# Patient Record
Sex: Male | Born: 1995 | Race: White | Hispanic: No | Marital: Single | Smoking: Never smoker
Health system: Southern US, Community
[De-identification: ages and names within clinical notes are randomized; demographics above are authoritative.]

## PROBLEM LIST (undated history)

## (undated) DIAGNOSIS — M1990 Unspecified osteoarthritis, unspecified site: Secondary | ICD-10-CM

## (undated) HISTORY — DX: Unspecified osteoarthritis, unspecified site: M19.90

## (undated) HISTORY — PX: TYMPANOSTOMY TUBE PLACEMENT: SHX32

---
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
## 2017-02-10 IMAGING — CR DG TOE GREAT 2+V*R*
1 series · 3 of 3 positions shown · non-contrast
Comparison: None.

CLINICAL DATA: Pain and swelling after kicking soccer ball and
hitting toe on ground

EXAM:
RIGHT FIRST TOE

[Series 1: dg toe great right · 0.14mm/px · 3 of 3 slices shown]
[im 1/3]
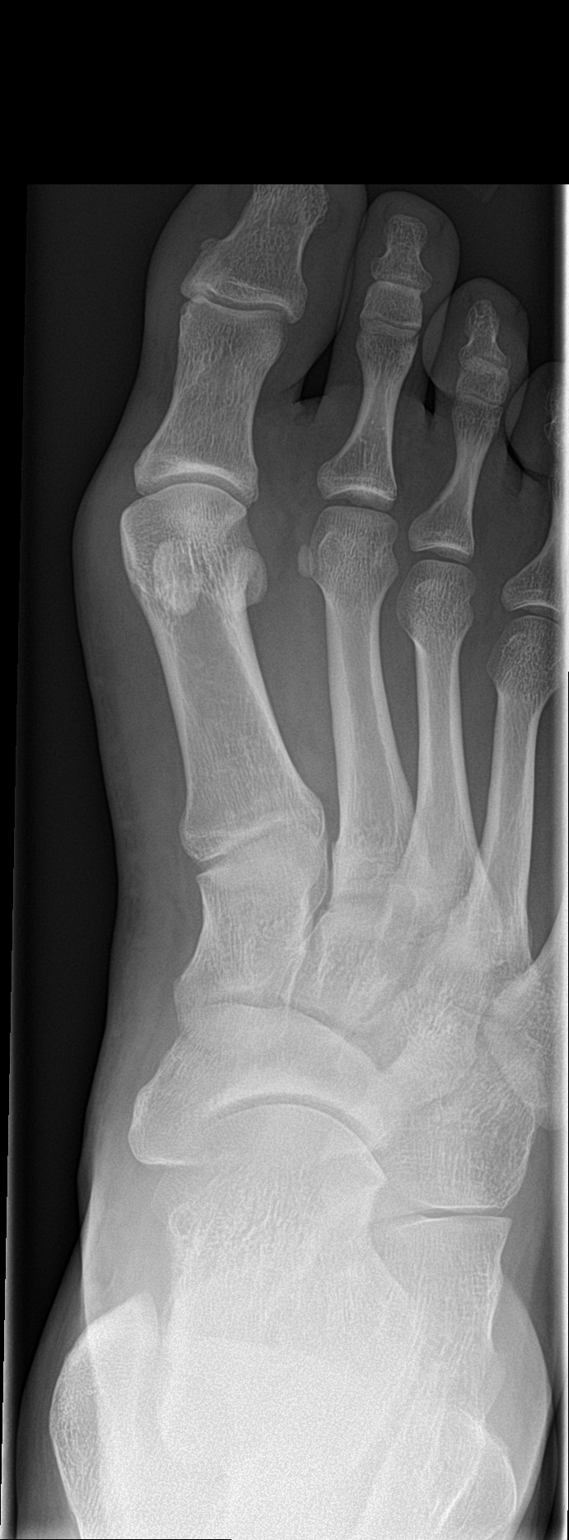
[im 2/3]
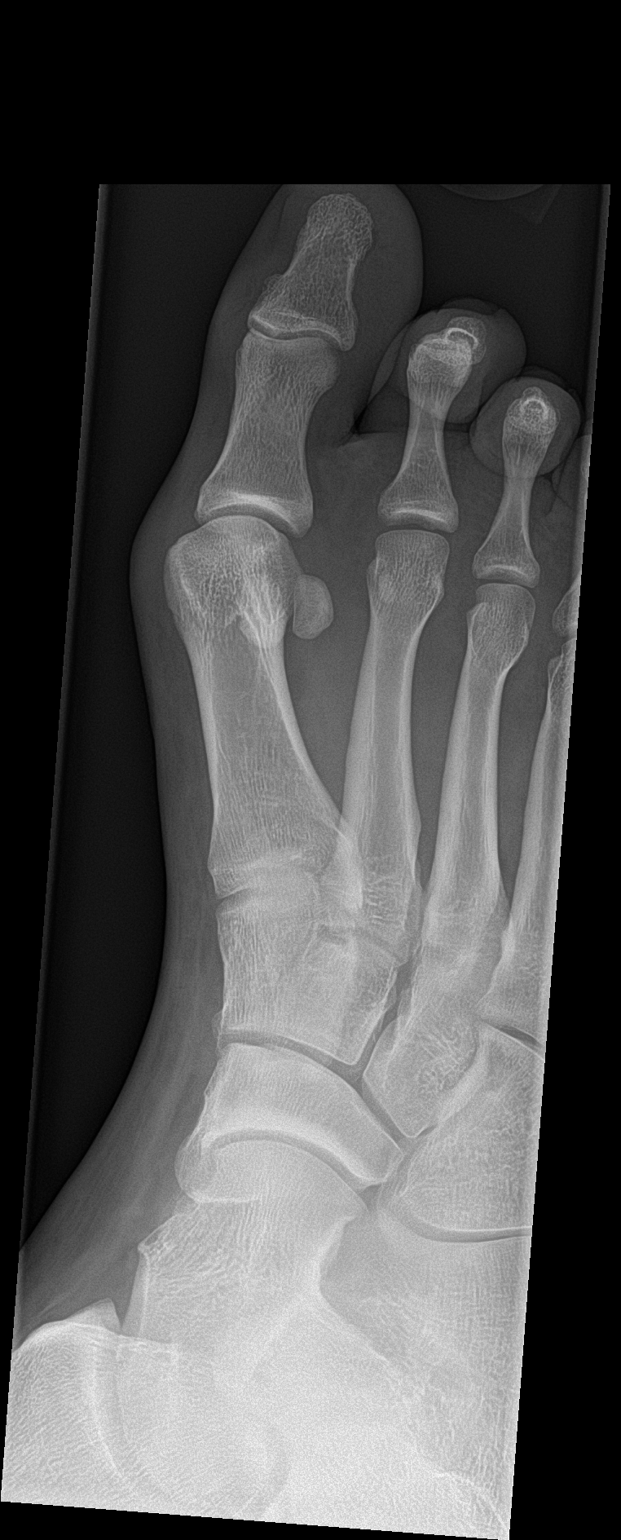
[im 3/3]
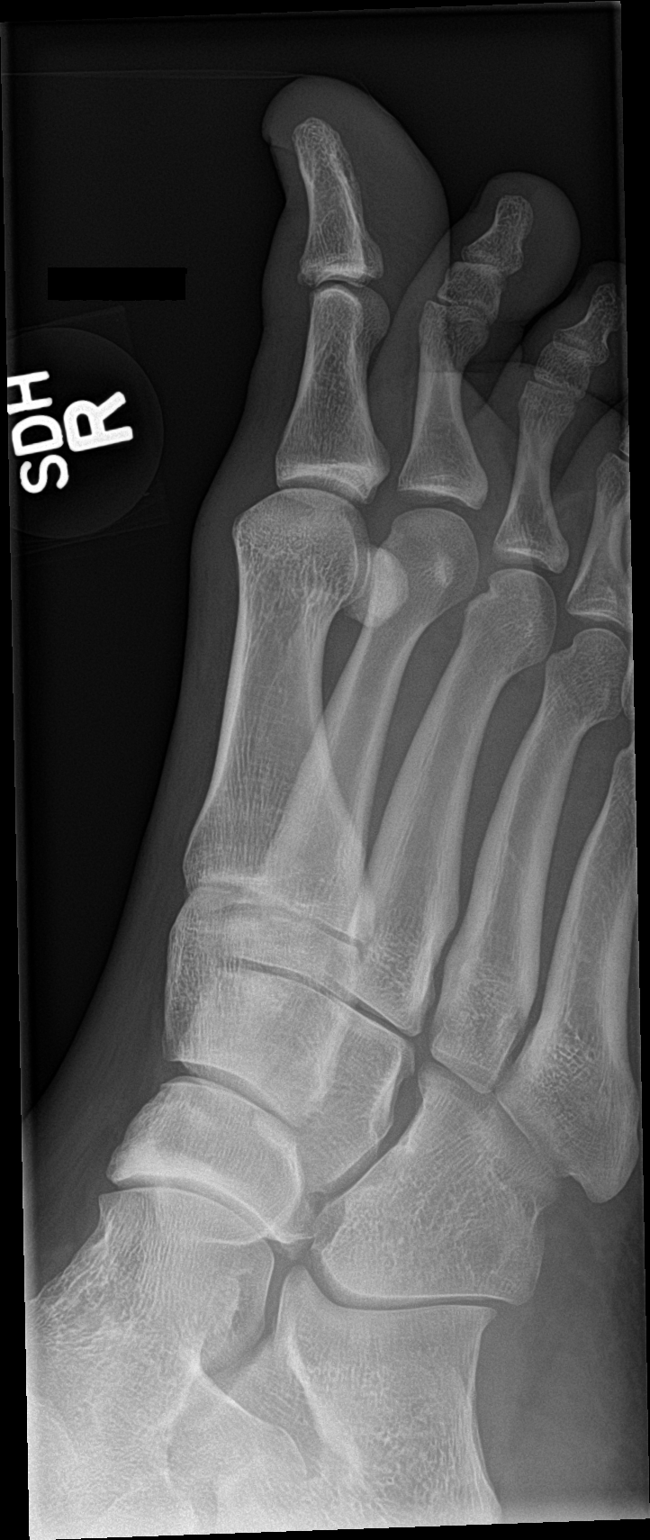

[3 of 3 positions shown; findings below may reference images not displayed]

FINDINGS: Frontal, oblique, and lateral views were obtained. There is an
avulsion along the dorsal aspect of the proximal portion of the
first distal phalanx. Alignment is near anatomic. No other fracture.
No dislocation. Joint spaces appear intact. There is slight hallux
valgus deformity at the first MTP joint.
IMPRESSION: Avulsion along the dorsal aspect of the proximal portion of the
first distal phalanx in near anatomic alignment. No other fracture.
No dislocation. Mild hallux valgus deformity at the first MTP joint.

## 2017-03-16 ENCOUNTER — Ambulatory Visit (INDEPENDENT_AMBULATORY_CARE_PROVIDER_SITE_OTHER): Payer: Medicaid Other | Admitting: Family Medicine

## 2017-03-16 ENCOUNTER — Encounter: Payer: Self-pay | Admitting: Family Medicine

## 2017-03-16 VITALS — BP 118/91 | HR 84 | Temp 98.2°F | Wt 289.0 lb

## 2017-03-16 DIAGNOSIS — M7662 Achilles tendinitis, left leg: Secondary | ICD-10-CM | POA: Diagnosis not present

## 2017-03-16 MED ORDER — PREDNISONE 10 MG PO TABS: ORAL_TABLET | ORAL | 0 refills | Status: AC

## 2017-03-16 NOTE — Progress Notes (Signed)
   BP (!) 118/91   Pulse 84   Temp 98.2 F (36.8 C)   Wt 289 lb (131.1 kg)    Subjective:    Patient ID: Xavier AmbleAustin C Haber, male    DOB: 07/18/1996, 21 y.o.   MRN: [id number]  HPI: Xavier Ambleustin C Jagoda is a 21 y.o. male  Chief Complaint  Patient presents with  . Knee Pain    right x 1 week, when he walks feels like something is pushing up on it. Feeling a little bit better. No swelling.  . Ankle Pain    left x 5 days. feels like someone is squeezing it. No swelling. No known injury.   Patient presents with about 1 week of left achilles tendon and right knee pain. States the knee pain is all but resolved but the achilles pain persists. 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 Trying ibuprofen and BC powders with mild relief. No known injury or previous issues like this, swelling, bruising, or decreased ROM.   Relevant past medical, surgical, family and social history reviewed and updated as indicated. Interim medical history since our last visit reviewed. Allergies and medications reviewed and updated.  Review of Systems  Constitutional: Negative.   Respiratory: Negative.   Cardiovascular: Negative.   Gastrointestinal: Negative.   Genitourinary: Negative.   Musculoskeletal: Positive for arthralgias.  Neurological: Negative for numbness.  Psychiatric/Behavioral: Negative.    Per HPI unless specifically indicated above     Objective:    BP (!) 118/91   Pulse 84   Temp 98.2 F (36.8 C)   Wt 289 lb (131.1 kg)   Wt Readings from Last 3 Encounters:  03/16/17 289 lb (131.1 kg) (>99 %, Z= 2.93)*  01/22/16 265 lb (120.2 kg) (>99 %, Z= 2.69)*  01/17/16 272 lb 12.8 oz (123.7 kg) (>99 %, Z= 2.79)*   * Growth percentiles are based on CDC 2-20 Years data.    Physical Exam  Constitutional: He is oriented to person, place, and time. He appears well-developed and well-nourished. No distress.    HENT:  Head: Atraumatic.  Eyes: Pupils are equal, round, and reactive to light. Conjunctivae are normal. No scleral icterus.  Neck: Normal range of motion. Neck supple.  Cardiovascular: Normal rate and normal heart sounds.   Pulmonary/Chest: Effort normal and breath sounds normal.  Musculoskeletal: Normal range of motion.  Minimal ttp and edema over left achilles Pain is reproduced with flexion at left ankle  Right knee with no edema or ttp, full strength and ROM intact  Neurological: He is alert and oriented to person, place, and time.  Skin: Skin is warm and dry.  Psychiatric: He has a normal mood and affect. His behavior is normal.  Nursing note and vitals reviewed.     Assessment & Plan:   Problem List Items Addressed This Visit    None    Visit Diagnoses    Achilles tendinitis of left lower extremity    -  Primary   Prednisone taper sent, tylenol prn for pain relief. Ice/heat alternating, gentle stretches (handout given). Rest, elevate, stay hydrated. F/u if no improvement      Follow up plan: Return if symptoms worsen or fail to improve.

## 2017-03-16 NOTE — Patient Instructions (Addendum)
Can take 1000 mg tylenol twice daily, can take 800 mg ibuprofen 1-2 times daily  If taking ibuprofen while taking prednisone, can add zantac or prilosec to help protect your stomach from irritation   Achilles Tendinitis Rehab Ask your health care provider which exercises are safe for you. Do exercises exactly as told by your health care provider and adjust them as directed. It is normal to feel mild stretching, pulling, tightness, or discomfort as you do these exercises, but you should stop right away if you feel sudden pain or your pain gets worse. Do not begin these exercises until told by your health care provider. Stretching and range of motion exercises These exercises warm up your muscles and joints and improve the movement and flexibility of your ankle. These exercises also help to relieve pain, numbness, and tingling. Exercise A: Standing wall calf stretch, knee straight  1. Stand with your hands against a wall. 2. Extend your __________ leg behind you and bend your front knee slightly. Keep both of your heels on the floor. 3. Point the toes of your back foot slightly inward. 4. Keeping your heels on the floor and your back knee straight, shift your weight toward the wall. Do not allow your back to arch. You should feel a gentle stretch in your calf. 5. Hold this position for seconds. Repeat __________ times. Complete this stretch __________ times per day. Exercise B: Standing wall calf stretch, knee bent 1. Stand with your hands against a wall. 2. Extend your __________ leg behind you, and bend your front knee slightly. Keep both of your heels on the floor. 3. Point the toes of your back foot slightly inward. 4. Keeping your heels on the floor, unlock your back knee so that it is bent. You should feel a gentle stretch deep in your calf. 5. Hold this position for __________ seconds. Repeat __________ times. Complete this stretch __________ times per day. Strengthening exercises These  exercises build strength and control of your ankle. Endurance is the ability to use your muscles for a long time, even after they get tired. Exercise C: Plantar flexion with band  1. Sit on the floor with your __________ leg extended. You may put a pillow under your calf to give your foot more room to move. 2. Loop a rubber exercise band or tube around the ball of your __________ foot. The ball of your foot is on the walking surface, right under your toes. The band or tube should be slightly tense when your foot is relaxed. If the band or tube slips, you can put on your shoe or put a washcloth between the band and your foot to help it stay in place. 3. Slowly point your toes downward, pushing them away from you. 4. Hold this position for __________ seconds. 5. Slowly release the tension in the band or tube, controlling smoothly until your foot is back to the starting position. Repeat __________ times. Complete this exercise __________ times per day. Exercise D: Heel raise with eccentric lower  1. Stand on a step with the balls of your feet. The ball of your foot is on the walking surface, right under your toes. ? Do not put your heels on the step. ? For balance, rest your hands on the wall or on a railing. 2. Rise up onto the balls of your feet. 3. Keeping your heels up, shift all of your weight to your __________ leg and pick up your other leg. 4. Slowly lower your __________  leg so your heel drops below the level of the step. 5. Put down your foot. If told by your health care provider, build up to:  3 sets of 15 repetitions while keeping your knees straight.  3 sets of 15 repetitions while keeping your knees bent as far as told by your health care provider.  Complete this exercise __________ times per day. If this exercise is too easy, try doing it while wearing a backpack with weights in it. Balance exercises These exercises improve or maintain your balance. Balance is important in  preventing falls. Exercise E: Single leg stand 1. Without shoes, stand near a railing or in a door frame. Hold on to the railing or door frame as needed. 2. Stand on your __________ foot. Keep your big toe down on the floor and try to keep your arch lifted. 3. Hold this position for __________ seconds. Repeat __________ times. Complete this exercise __________ times per day. If this exercise is too easy, you can try it with your eyes closed or while standing on a pillow. This information is not intended to replace advice given to you by your health care provider. Make sure you discuss any questions you have with your health care provider. Document Released: 03/18/2005 Document Revised: 04/23/2016 Document Reviewed: 04/23/2015 Elsevier Interactive Patient Education  [employer]2018 [employer]

## 2021-11-01 DIAGNOSIS — S61211A Laceration without foreign body of left index finger without damage to nail, initial encounter: Secondary | ICD-10-CM | POA: Diagnosis not present

## 2021-11-17 DIAGNOSIS — S61219D Laceration without foreign body of unspecified finger without damage to nail, subsequent encounter: Secondary | ICD-10-CM | POA: Diagnosis not present

## 2021-12-05 DIAGNOSIS — S61219D Laceration without foreign body of unspecified finger without damage to nail, subsequent encounter: Secondary | ICD-10-CM | POA: Diagnosis not present

## 2023-04-15 DIAGNOSIS — I10 Essential (primary) hypertension: Secondary | ICD-10-CM | POA: Diagnosis not present

## 2023-04-15 DIAGNOSIS — R58 Hemorrhage, not elsewhere classified: Secondary | ICD-10-CM | POA: Diagnosis not present

## 2023-04-16 ENCOUNTER — Emergency Department (HOSPITAL_COMMUNITY): Payer: Medicaid Other

## 2023-04-16 ENCOUNTER — Emergency Department (HOSPITAL_COMMUNITY)
Admission: EM | Admit: 2023-04-16 | Discharge: 2023-04-16 | Disposition: A | Discharge status: Home / Self Care | Payer: Medicaid Other | Attending: Emergency Medicine | Admitting: Emergency Medicine

## 2023-04-16 ENCOUNTER — Encounter (HOSPITAL_COMMUNITY): Payer: Self-pay

## 2023-04-16 ENCOUNTER — Other Ambulatory Visit: Payer: Self-pay

## 2023-04-16 DIAGNOSIS — S0181XA Laceration without foreign body of other part of head, initial encounter: Secondary | ICD-10-CM | POA: Diagnosis not present

## 2023-04-16 DIAGNOSIS — R7309 Other abnormal glucose: Secondary | ICD-10-CM

## 2023-04-16 DIAGNOSIS — S0003XA Contusion of scalp, initial encounter: Secondary | ICD-10-CM | POA: Diagnosis not present

## 2023-04-16 DIAGNOSIS — R739 Hyperglycemia, unspecified: Secondary | ICD-10-CM | POA: Diagnosis not present

## 2023-04-16 DIAGNOSIS — S199XXA Unspecified injury of neck, initial encounter: Secondary | ICD-10-CM | POA: Diagnosis not present

## 2023-04-16 DIAGNOSIS — S0990XA Unspecified injury of head, initial encounter: Secondary | ICD-10-CM | POA: Diagnosis not present

## 2023-04-16 DIAGNOSIS — R030 Elevated blood-pressure reading, without diagnosis of hypertension: Secondary | ICD-10-CM | POA: Insufficient documentation

## 2023-04-16 DIAGNOSIS — Y990 Civilian activity done for income or pay: Secondary | ICD-10-CM | POA: Insufficient documentation

## 2023-04-16 LAB — BASIC METABOLIC PANEL
Anion gap: 11 (ref 5–15)
BUN: 12 mg/dL (ref 6–20)
CO2: 27 mmol/L (ref 22–32)
Calcium: 8.5 mg/dL — ABNORMAL LOW (ref 8.9–10.3)
Chloride: 100 mmol/L (ref 98–111)
Creatinine, Ser: 1.13 mg/dL (ref 0.61–1.24)
GFR, Estimated: >60 mL/min (ref >60)
Glucose, Bld: 148 mg/dL — ABNORMAL HIGH (ref 70–99)
Potassium: 3.6 mmol/L (ref 3.5–5.1)
Sodium: 138 mmol/L (ref 135–145)

## 2023-04-16 LAB — CBC WITH DIFFERENTIAL/PLATELET
Abs Immature Granulocytes: 0.06 10*3/uL (ref 0.00–0.07)
Basophils Absolute: 0 10*3/uL (ref 0.0–0.1)
Basophils Relative: 0 %
Eosinophils Absolute: 0.3 10*3/uL (ref 0.0–0.5)
Eosinophils Relative: 3 %
HCT: 42.7 % (ref 39.0–52.0)
Hemoglobin: 13.8 g/dL (ref 13.0–17.0)
Immature Granulocytes: 1 %
Lymphocytes Relative: 19 %
Lymphs Abs: 1.9 10*3/uL (ref 0.7–4.0)
MCH: 26.8 pg (ref 26.0–34.0)
MCHC: 32.3 g/dL (ref 30.0–36.0)
MCV: 82.9 fL (ref 80.0–100.0)
Monocytes Absolute: 0.7 10*3/uL (ref 0.1–1.0)
Monocytes Relative: 7 %
Neutro Abs: 6.9 10*3/uL (ref 1.7–7.7)
Neutrophils Relative %: 70 %
Platelets: 314 10*3/uL (ref 150–400)
RBC: 5.15 MIL/uL (ref 4.22–5.81)
RDW: 13.2 % (ref 11.5–15.5)
WBC: 9.8 10*3/uL (ref 4.0–10.5)
nRBC: 0 % (ref 0.0–0.2)

## 2023-04-16 LAB — TYPE AND SCREEN
ABO/RH(D): O POS
Antibody Screen: NEGATIVE

## 2023-04-16 LAB — ABO/RH: ABO/RH(D): O POS

## 2023-04-16 MED ORDER — SODIUM CHLORIDE 0.9 % IV BOLUS
1000.0000 mL | Freq: Once | INTRAVENOUS | Status: AC
  Administered 2023-04-16: 1000 mL via INTRAVENOUS

## 2023-04-16 NOTE — Discharge Instructions (Signed)
Your blood sugar was high today.  That may be because of the stress of being in the emergency department, but it could also be that you have high blood pressure.  Please have your blood pressure checked several times over the next 2 weeks.  If it is consistently high, you will need to start treatment for it.  High blood pressure which is not adequately treated can lead to heart attacks, strokes, kidney failure.

## 2023-04-16 NOTE — ED Provider Notes (Signed)
[place] EMERGENCY DEPARTMENT AT [facility] Provider Note   CSN: [id number] Arrival date & time: 04/16/23  0004     History  Chief Complaint  Patient presents with   Trauma   Head Injury    Xavier Rosario is a 27 y.o. male.  The history is provided by the patient and the EMS personnel.  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  He denies loss of consciousness.  He denies pain anywhere besides where the laceration was.  He specifically denies neck, back, chest, abdomen, extremity pain.  Last tetanus immunization was within the past year.  EMS reports large, deep laceration and inability to control bleeding.   Home Medications Prior to Admission medications   Medication Sig Start Date End Date Taking? Authorizing Provider  EPINEPHrine (ADRENALIN) 1 MG/ML injection Inject 1 mL (1 mg total) into the skin once. If start swelling after having any bug bit in future. Then go to ER or call 911 Immediately. 03/09/15   Altamese Dilling, MD  predniSONE (DELTASONE) 10 MG tablet Take 6 tabs day 1, then 5 tabs day 2, then 4 tabs day 3, etc 03/16/17   Particia Nearing, PA-C      Allergies    Wasp venom and Yellow jacket venom    Review of Systems   Review of Systems  All other systems reviewed and are negative.   Physical Exam Updated Vital Signs BP (!) 154/70 (BP Location: Right Arm)   Pulse (!) 104   Temp 98.2 F (36.8 C) (Oral)   Resp 16   Ht 6\' 1"  (1.854 m)   Wt (!) 181.4 kg   SpO2 99%   BMI 52.77 kg/m  Physical Exam Vitals and nursing note reviewed.   27 year old male, resting comfortably and in no acute distress. Vital signs are significant for elevated blood pressure and heart rate. Oxygen saturation is 99%, which is normal. Head is normocephalic.  Deep laceration is present across the right upper forehead with steady streaming of blood but no pulsatile bleeding.  PERRLA, EOMI. Oropharynx is clear. Neck is nontender. Back is nontender. Lungs are clear without rales, wheezes, or rhonchi. Chest is nontender. Heart has regular rate and rhythm without murmur. Abdomen is soft, flat, nontender. Extremities have no signs of trauma, full range of motion is present. Skin is warm and dry without rash. Neurologic: Mental status is normal, cranial nerves are intact, moves all extremities equally.     ED Results / Procedures / Treatments   Labs (all labs ordered are listed, but only abnormal results are displayed) Labs Reviewed  BASIC METABOLIC PANEL - Abnormal; Notable for the following components:      Result Value   Glucose, Bld 148 (*)    Calcium 8.5 (*)    All other components within normal limits  CBC WITH DIFFERENTIAL/PLATELET  TYPE AND SCREEN  ABO/RH   Radiology CT Head Wo Contrast  Result Date: 04/16/2023 CLINICAL DATA:  Head trauma, moderate-severe; Neck trauma, dangerous injury mechanism (Age 60-64y) Pt from work after forklift accident. Pt hit something and was thrown forward hitting his head on a metal bar EXAM: CT HEAD WITHOUT CONTRAST CT CERVICAL SPINE WITHOUT CONTRAST TECHNIQUE: Multidetector CT imaging of the head and cervical spine was performed following the standard protocol without intravenous contrast. Multiplanar CT image reconstructions of the cervical spine were also generated. RADIATION DOSE REDUCTION: This exam  was performed according to the departmental dose-optimization program which includes automated exposure control, adjustment of the mA and/or kV according to patient size and/or use of iterative reconstruction technique. COMPARISON:  None Available. FINDINGS: CT HEAD FINDINGS Brain: No evidence of large-territorial acute infarction. No parenchymal hemorrhage. No mass lesion. No extra-axial collection. No mass effect or midline shift. No hydrocephalus. Basilar cisterns are patent. Vascular: No hyperdense vessel. Skull: No acute  fracture or focal lesion. Sinuses/Orbits: Paranasal sinuses and mastoid air cells are clear. The orbits are unremarkable. Other: Right frontal scalp 6 mm hematoma formation. CT CERVICAL SPINE FINDINGS Alignment: Normal. Skull base and vertebrae: Degenerative changes of the C4-C5 level. No acute fracture. No aggressive appearing focal osseous lesion or focal pathologic process. Soft tissues and spinal canal: No prevertebral fluid or swelling. No visible canal hematoma. Upper chest: Unremarkable. Other: None. IMPRESSION: 1. No acute intracranial abnormality. 2. No acute displaced fracture or traumatic listhesis of the cervical spine. Electronically Signed   By: Tish Frederickson M.D.   On: 04/16/2023 01:18   CT Cervical Spine Wo Contrast  Result Date: 04/16/2023 CLINICAL DATA:  Head trauma, moderate-severe; Neck trauma, dangerous injury mechanism (Age 69-64y) Pt from work after forklift accident. Pt hit something and was thrown forward hitting his head on a metal bar EXAM: CT HEAD WITHOUT CONTRAST CT CERVICAL SPINE WITHOUT CONTRAST TECHNIQUE: Multidetector CT imaging of the head and cervical spine was performed following the standard protocol without intravenous contrast. Multiplanar CT image reconstructions of the cervical spine were also generated. RADIATION DOSE REDUCTION: This exam was performed according to the departmental dose-optimization program which includes automated exposure control, adjustment of the mA and/or kV according to patient size and/or use of iterative reconstruction technique. COMPARISON:  None Available. FINDINGS: CT HEAD FINDINGS Brain: No evidence of large-territorial acute infarction. No parenchymal hemorrhage. No mass lesion. No extra-axial collection. No mass effect or midline shift. No hydrocephalus. Basilar cisterns are patent. Vascular: No hyperdense vessel. Skull: No acute fracture or focal lesion. Sinuses/Orbits: Paranasal sinuses and mastoid air cells are clear. The orbits are  unremarkable. Other: Right frontal scalp 6 mm hematoma formation. CT CERVICAL SPINE FINDINGS Alignment: Normal. Skull base and vertebrae: Degenerative changes of the C4-C5 level. No acute fracture. No aggressive appearing focal osseous lesion or focal pathologic process. Soft tissues and spinal canal: No prevertebral fluid or swelling. No visible canal hematoma. Upper chest: Unremarkable. Other: None. IMPRESSION: 1. No acute intracranial abnormality. 2. No acute displaced fracture or traumatic listhesis of the cervical spine. Electronically Signed   By: Tish Frederickson M.D.   On: 04/16/2023 01:18    Procedures .Marland KitchenLaceration Repair  Date/Time: 04/16/2023 12:37 AM  Performed by: Dione Booze, MD Authorized by: Dione Booze, MD   Consent:    Consent obtained:  Verbal   Consent given by:  Patient   Risks discussed:  Pain and poor cosmetic result   Alternatives discussed:  No treatment Universal protocol:    Procedure explained and questions answered to patient or proxy's satisfaction: yes     Relevant documents present and verified: yes     Required blood products, implants, devices, and special equipment available: yes     Site/side marked: yes     Immediately prior to procedure, a time out was called: yes     Patient identity confirmed:  Verbally with patient and arm band Anesthesia:    Anesthesia method:  Local infiltration   Local anesthetic:  Lidocaine 1% WITH epi Laceration details:    Location:  Face   Face location:  Forehead   Length (cm):  11   Depth (mm):  8 Pre-procedure details:    Preparation:  Patient was prepped and draped in usual sterile fashion Exploration:    Limited defect created (wound extended): no     Hemostasis achieved with:  Direct pressure   Wound extent: no foreign body and no underlying fracture     Contaminated: no   Treatment:    Area cleansed with:  Saline   Amount of cleaning:  Standard   Debridement:  None Skin repair:    Repair method:   Sutures   Suture size:  4-0   Suture material:  Prolene   Suture technique:  Running Approximation:    Approximation:  Close Repair type:    Repair type:  Simple Post-procedure details:    Dressing:  Antibiotic ointment and sterile dressing   Procedure completion:  Tolerated well, no immediate complications   Cardiac monitor shows sinus tachycardia, per my interpretation.  Medications Ordered in ED Medications  sodium chloride 0.9 % bolus 1,000 mL (1,000 mLs Intravenous Bolus 04/16/23 0100)    ED Course/ Medical Decision Making/ A&P                                 Medical Decision Making Amount and/or Complexity of Data Reviewed Labs: ordered. Radiology: ordered.   Forklift accident with deep laceration of the right forehead.  Hemostasis was not able to be obtained with direct pressure, so I emergently placed sutures following which hemostasis was achieved.  I have ordered screening labs of CBC, basic metabolic panel and I have ordered CT of head and cervical spine.  CT scans show no bony injury or intracranial injury.  I have independently viewed the images, and agree with radiologist's interpretation.  I have reviewed and interpreted his laboratory test and my interpretation is elevated random glucose which will need to be followed as an outpatient.  Hemoglobin is normal.  Heart rate has improved with IV hydration and is now normal, but blood pressure is still elevated.  I am discharging him with instructions to have sutures removed in 5 days, follow-up as outpatient regarding blood pressure and glucose.  Final Clinical Impression(s) / ED Diagnoses Final diagnoses:  Laceration of forehead, initial encounter  Elevated blood pressure reading without diagnosis of hypertension  Elevated random blood glucose level  Forklift accident, initial encounter    Rx / DC Orders ED Discharge Orders     None         Dione Booze, MD 04/16/23 [phone]

## 2023-04-16 NOTE — ED Notes (Signed)
Pt cleaned and wound dressing with non adherent dressing.

## 2023-04-16 NOTE — ED Triage Notes (Signed)
Pt from work after forklift accident. Pt hit something and was thrown forward hitting his head on a metal bar. Appr0x 6-7 inch large gash to right forehead, uncontrolled bleeding

## 2023-04-20 ENCOUNTER — Emergency Department
Admission: EM | Admit: 2023-04-20 | Discharge: 2023-04-20 | Disposition: A | Discharge status: Home / Self Care | Attending: Emergency Medicine | Admitting: Emergency Medicine

## 2023-04-20 ENCOUNTER — Other Ambulatory Visit: Payer: Self-pay

## 2023-04-20 ENCOUNTER — Telehealth: Payer: Self-pay | Admitting: Family Medicine

## 2023-04-20 ENCOUNTER — Ambulatory Visit: Payer: Medicaid Other

## 2023-04-20 DIAGNOSIS — Z4800 Encounter for change or removal of nonsurgical wound dressing: Secondary | ICD-10-CM | POA: Insufficient documentation

## 2023-04-20 DIAGNOSIS — Z4801 Encounter for change or removal of surgical wound dressing: Secondary | ICD-10-CM | POA: Diagnosis not present

## 2023-04-20 DIAGNOSIS — Z5189 Encounter for other specified aftercare: Secondary | ICD-10-CM

## 2023-04-20 NOTE — Telephone Encounter (Signed)
Patient was scheduled for a nurse visit to have stitcher removed. CMA noticed the reason for his visit  he has not been seen since 2018 and would need to be established as a new patient to be seen, and stitches can not be removed for a nurse visit.Contact information listed was used to call patient and  inform his of this information a message.

## 2023-04-20 NOTE — ED Triage Notes (Signed)
Pt to ED for suture removal.

## 2023-04-20 NOTE — ED Provider Notes (Signed)
[facility] Provider Note  Patient Contact: 4:46 PM (approximate)   History   No chief complaint on file.   HPI  Xavier Rosario is a 27 y.o. male  hwo presents to the ED with a healing laceration to the R forehead. He is here for suture removal. He was seen at        Physical Exam   Triage Vital Signs: ED Triage Vitals  Encounter Vitals Group     BP 04/20/23 1428 133/76     Systolic BP Percentile --      Diastolic BP Percentile --      Pulse Rate 04/20/23 1427 85     Resp 04/20/23 1427 18     Temp 04/20/23 1427 98.5 F (36.9 C)     Temp src --      SpO2 04/20/23 1427 100 %     Weight 04/20/23 1427 (!) 399 lb 0.5 oz (181 kg)     Height 04/20/23 1427 6\' 1"  (1.854 m)     Head Circumference --      Peak Flow --      Pain Score 04/20/23 1427 0     Pain Loc --      Pain Education --      Exclude from Growth Chart --     Most recent vital signs: Vitals:   04/20/23 1427 04/20/23 1428  BP:  133/76  Pulse: 85   Resp: 18   Temp: 98.5 F (36.9 C)   SpO2: 100%      General: Alert and in no acute distress. Head: Sutured laceration noted to the R forehead. No evidence of infection, dehiscence. Looking at the laceration, edges are healing but do not appear ready for suture removal.  Cardiovascular:  Good peripheral perfusion Respiratory: Normal respiratory effort without tachypnea or retractions. Lungs CTAB.  Musculoskeletal: Full range of motion to all extremities.  Neurologic:  No gross focal neurologic deficits are appreciated.  Skin:   No rash noted Other:   ED Results / Procedures / Treatments   Labs (all labs ordered are listed, but only abnormal results are displayed) Labs Reviewed - No data to display   EKG     RADIOLOGY    No results found.  PROCEDURES:  Critical Care performed: No  Procedures   MEDICATIONS ORDERED IN ED: Medications - No data to display   IMPRESSION / MDM / ASSESSMENT AND PLAN / ED  COURSE  I reviewed the triage vital signs and the nursing notes.                                 Differential diagnosis includes, but is not limited to, suture removal, wound infection, wound dehiscence   Patient's presentation is most consistent with acute presentation with potential threat to life or bodily function.   Patient's diagnosis is consistent with encounter for evaluation of suture removal. Patient arrives with a sutured laceration to the R forehead. No evidence of infection but does not appear ready to have sutures removed. Patient should return in 3-4 days for re-evaluation. Patient agreeable with the plan..  Patient is given ED precautions to return to the ED for any worsening or new symptoms.     FINAL CLINICAL IMPRESSION(S) / ED DIAGNOSES   Final diagnoses:  Visit for wound check     Rx / DC Orders   ED Discharge Orders  None        Note:  This document was prepared using Dragon voice recognition software and may include unintentional dictation errors.   Lanette Hampshire 04/20/23 1705    Sharman Cheek, MD 04/20/23 2317

## 2023-04-21 ENCOUNTER — Telehealth: Payer: Self-pay | Admitting: Family Medicine

## 2023-04-21 NOTE — Telephone Encounter (Signed)
Copied from CRM [id number]. Topic: General - Other >> Apr 21, 2023  8:44 AM Everette C wrote: Reason for CRM: The patient's mother would like to be contacted by by a member of practice administration to discuss the patient and their care when possible   Please contact further when possible

## 2023-04-24 DIAGNOSIS — S0181XA Laceration without foreign body of other part of head, initial encounter: Secondary | ICD-10-CM | POA: Diagnosis not present

## 2023-04-28 DIAGNOSIS — Z4802 Encounter for removal of sutures: Secondary | ICD-10-CM | POA: Diagnosis not present
